# Patient Record
Sex: Female | Born: 2005 | Race: White | Hispanic: No | Marital: Single | Smoking: Never smoker
Health system: Southern US, Community
[De-identification: ages and names within clinical notes are randomized; demographics above are authoritative.]

## PROBLEM LIST (undated history)

## (undated) DIAGNOSIS — J302 Other seasonal allergic rhinitis: Secondary | ICD-10-CM

## (undated) DIAGNOSIS — F419 Anxiety disorder, unspecified: Secondary | ICD-10-CM

## (undated) DIAGNOSIS — N390 Urinary tract infection, site not specified: Secondary | ICD-10-CM

---
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
## 2024-08-25 ENCOUNTER — Other Ambulatory Visit: Payer: Self-pay

## 2024-08-25 ENCOUNTER — Emergency Department
Admission: EM | Admit: 2024-08-25 | Discharge: 2024-08-25 | Disposition: A | Discharge status: Home / Self Care | Attending: Emergency Medicine | Admitting: Emergency Medicine

## 2024-08-25 ENCOUNTER — Encounter: Payer: Self-pay | Admitting: Emergency Medicine

## 2024-08-25 DIAGNOSIS — J029 Acute pharyngitis, unspecified: Secondary | ICD-10-CM | POA: Diagnosis present

## 2024-08-25 DIAGNOSIS — R7989 Other specified abnormal findings of blood chemistry: Secondary | ICD-10-CM | POA: Insufficient documentation

## 2024-08-25 DIAGNOSIS — R6889 Other general symptoms and signs: Secondary | ICD-10-CM

## 2024-08-25 DIAGNOSIS — R059 Cough, unspecified: Secondary | ICD-10-CM | POA: Insufficient documentation

## 2024-08-25 DIAGNOSIS — D72829 Elevated white blood cell count, unspecified: Secondary | ICD-10-CM | POA: Insufficient documentation

## 2024-08-25 LAB — CBC
HCT: 37.9 % (ref 36.0–46.0)
Hemoglobin: 12.7 g/dL (ref 12.0–15.0)
MCH: 29.8 pg (ref 26.0–34.0)
MCHC: 33.5 g/dL (ref 30.0–36.0)
MCV: 89 fL (ref 80.0–100.0)
Platelets: 326 K/uL (ref 150–400)
RBC: 4.26 MIL/uL (ref 3.87–5.11)
RDW: 12.6 % (ref 11.5–15.5)
WBC: 15.6 K/uL — ABNORMAL HIGH (ref 4.0–10.5)
nRBC: 0 % (ref 0.0–0.2)

## 2024-08-25 LAB — LIPASE, BLOOD: Lipase: 19 U/L (ref 11–51)

## 2024-08-25 LAB — COMPREHENSIVE METABOLIC PANEL WITH GFR
ALT: 98 U/L — ABNORMAL HIGH (ref 0–44)
AST: 46 U/L — ABNORMAL HIGH (ref 15–41)
Albumin: 4.7 g/dL (ref 3.5–5.0)
Alkaline Phosphatase: 131 U/L — ABNORMAL HIGH (ref 38–126)
Anion gap: 10 (ref 5–15)
BUN: 14 mg/dL (ref 6–20)
CO2: 24 mmol/L (ref 22–32)
Calcium: 9.5 mg/dL (ref 8.9–10.3)
Chloride: 103 mmol/L (ref 98–111)
Creatinine, Ser: 0.83 mg/dL (ref 0.44–1.00)
GFR, Estimated: >60 mL/min
Glucose, Bld: 109 mg/dL — ABNORMAL HIGH (ref 70–99)
Potassium: 3.8 mmol/L (ref 3.5–5.1)
Sodium: 137 mmol/L (ref 135–145)
Total Bilirubin: 0.7 mg/dL (ref 0.0–1.2)
Total Protein: 7.8 g/dL (ref 6.5–8.1)

## 2024-08-25 LAB — URINALYSIS, ROUTINE W REFLEX MICROSCOPIC
Bilirubin Urine: NEGATIVE
Glucose, UA: NEGATIVE mg/dL
Ketones, ur: NEGATIVE mg/dL
Nitrite: POSITIVE — AB
Protein, ur: NEGATIVE mg/dL
Specific Gravity, Urine: 1.021 (ref 1.005–1.030)
Squamous Epithelial / HPF: >50 /HPF (ref 0–5)
WBC, UA: >50 WBC/hpf (ref 0–5)
pH: 6 (ref 5.0–8.0)

## 2024-08-25 LAB — RESP PANEL BY RT-PCR (RSV, FLU A&B, COVID)  RVPGX2
Influenza A by PCR: NEGATIVE
Influenza B by PCR: NEGATIVE
Resp Syncytial Virus by PCR: NEGATIVE
SARS Coronavirus 2 by RT PCR: NEGATIVE

## 2024-08-25 LAB — POC URINE PREG, ED: Preg Test, Ur: NEGATIVE

## 2024-08-25 LAB — GROUP A STREP BY PCR: Group A Strep by PCR: NOT DETECTED

## 2024-08-25 LAB — MONONUCLEOSIS SCREEN: Mono Screen: NEGATIVE

## 2024-08-25 MED ORDER — ONDANSETRON 4 MG PO TBDP
4.0000 mg | ORAL_TABLET | Freq: Once | ORAL | Status: AC
  Administered 2024-08-25: 4 mg via ORAL
  Filled 2024-08-25: qty 1

## 2024-08-25 MED ORDER — LIDOCAINE VISCOUS HCL 2 % MT SOLN
15.0000 mL | Freq: Once | OROMUCOSAL | Status: AC
  Administered 2024-08-25: 15 mL via OROMUCOSAL
  Filled 2024-08-25: qty 15

## 2024-08-25 MED ORDER — IBUPROFEN 800 MG PO TABS
800.0000 mg | ORAL_TABLET | Freq: Once | ORAL | Status: AC
  Administered 2024-08-25: 800 mg via ORAL
  Filled 2024-08-25: qty 1

## 2024-08-25 NOTE — ED Triage Notes (Signed)
 Pt reports waking up yesterday morning (12/23) w/ sore throat, generalized body aches, and chills and generalized abd pain w/ nausea that started around the same time.

## 2024-08-25 NOTE — ED Notes (Signed)
 Respiratory and strep swab; (2)greens and lav top tubes collected, labeled, and sent to lab.

## 2024-08-25 NOTE — Discharge Instructions (Addendum)
 Your COVID, flu, RSV and strep test today were negative.  Your Mono test is negative as well.  Your chest x-ray showed no pneumonia.  Your lab work showed elevated white blood cell count which is something that we see normally with infection.  Your liver function test were very minimally elevated.  I recommend avoiding Tylenol, alcohol, eating a low-fat diet and following up with your primary care doctor in 1 to 2 weeks to follow this.  I do not think that this is related to your illness today but could be secondary to fatty liver disease.   You may take ibuprofen  800 mg every 6 hours as needed for fever and pain.  Please take ibuprofen  with food.Please rest and drink plenty of fluids.   This is a viral illness causing your symptoms. You do not need antibiotics for a virus. Symptoms from a virus may take 7-14 days to run its course.  You may use over-the-counter nasal saline spray and Afrin nasal saline spray as needed for nasal congestion. Please do not use Afrin for more than 3 days in a row as this can cause worsening congestion. You may use guaifenesin and dextromethorphan as needed for cough.  You may use lozenges and Chloraseptic spray to help with sore throat.  Warm salt water gargles can also help with sore throat.  Honey has also been used successfully to help with cough, congestion.  You may use over-the-counter Unisom (doxyalamine) or Benadryl (diphenhydramine) to help with sleep.  If you have diarrhea, you may use over-the-counter Imodium.  Medication such as Pepcid , Mylanta or Maalox can help with symptoms of acid reflux and upper abdominal pain from upset stomach and recurrent vomiting.  Please note that some combination medicines such as DayQuil and NyQuil have multiple medications in them.  Please make sure you look at all labels to ensure that you are not taking too much of any one particular medication.

## 2024-08-25 NOTE — ED Provider Notes (Signed)
 "  [facility] Provider Note    Event Date/Time   First MD Initiated Contact with Patient 08/25/24 0559     (approximate)   History   Abdominal Pain and Sore Throat   HPI  Kayla Higgins is a 18 y.o. female with history of anxiety, seasonal allergies, recurrent UTIs who presents to the emergency department with complaints of headache, fevers, chills, body aches, cough, congestion, sore throat that started yesterday.  Has had nausea but no vomiting or diarrhea.  No dysuria, hematuria, urinary frequency or urgency, vaginal bleeding or discharge.  No known sick contacts.   History provided by patient and mother.    Past Medical History:  Diagnosis Date   Anxiety    Seasonal allergies    UTI (urinary tract infection)     History reviewed. No pertinent surgical history.  MEDICATIONS:  Prior to Admission medications  Medication Sig Start Date End Date Taking? Authorizing Provider  cephALEXin  (KEFLEX ) 500 MG capsule Take 1 capsule (500 mg total) by mouth 2 (two) times daily. 04/08/21   Gwenn Kent, MD  cyclobenzaprine  (FLEXERIL ) 5 MG tablet 1 tablet every 8 hours as needed for muscle spasms 05/11/22   Sung, Jade J, MD  naproxen  (NAPROSYN ) 500 MG tablet Take 1 tablet (500 mg total) by mouth 2 (two) times daily with a meal. 05/11/22   Robinette Vermell PARAS, MD  phenazopyridine  (PYRIDIUM ) 200 MG tablet Take 1 tablet (200 mg total) by mouth 3 (three) times daily. 04/08/21   Gwenn Kent, MD    Physical Exam   Triage Vital Signs: ED Triage Vitals [08/25/24 0331]  Encounter Vitals Group     BP (!) 141/75     Girls Systolic BP Percentile      Girls Diastolic BP Percentile      Boys Systolic BP Percentile      Boys Diastolic BP Percentile      Pulse Rate (!) 122     Resp 18     Temp 100.1 F (37.8 C)     Temp Source Oral     SpO2 97 %     Weight      Height      Head Circumference      Peak Flow      Pain Score 7     Pain Loc      Pain Education       Exclude from Growth Chart     Most recent vital signs: Vitals:   08/25/24 0730 08/25/24 0830  BP:  97/68  Pulse: 99 100  Resp:  20  Temp:  99.1 F (37.3 C)  SpO2: 96% 100%    CONSTITUTIONAL: Alert, responds appropriately to questions. Well-appearing; well-nourished HEAD: Normocephalic, atraumatic EYES: Conjunctivae clear, pupils appear equal, sclera nonicteric ENT: normal nose; moist mucous membranes, mild posterior pharyngeal erythema and tonsillar hypertrophy without exudate, no uvular deviation, no trismus or drooling, normal phonation NECK: Supple, normal ROM, no meningismus CARD: Regular and tachycardic; S1 and S2 appreciated RESP: Normal chest excursion without splinting or tachypnea; breath sounds clear and equal bilaterally; no wheezes, no rhonchi, no rales, no hypoxia or respiratory distress, speaking full sentences ABD/GI: Non-distended; soft, non-tender, no rebound, no guarding, no peritoneal signs, no tenderness in the right upper quadrant or McBurney's point BACK: The back appears normal EXT: Normal ROM in all joints; no deformity noted, no edema SKIN: Normal color for age and race; warm; no rash on exposed skin NEURO: Moves all extremities equally, normal  speech PSYCH: The patient's mood and manner are appropriate.   ED Results / Procedures / Treatments   LABS: (all labs ordered are listed, but only abnormal results are displayed) Labs Reviewed  COMPREHENSIVE METABOLIC PANEL WITH GFR - Abnormal; Notable for the following components:      Result Value   Glucose, Bld 109 (*)    AST 46 (*)    ALT 98 (*)    Alkaline Phosphatase 131 (*)    All other components within normal limits  CBC - Abnormal; Notable for the following components:   WBC 15.6 (*)    All other components within normal limits  URINALYSIS, ROUTINE W REFLEX MICROSCOPIC - Abnormal; Notable for the following components:   Color, Urine YELLOW (*)    APPearance CLOUDY (*)    Hgb urine dipstick  MODERATE (*)    Nitrite POSITIVE (*)    Leukocytes,Ua MODERATE (*)    Bacteria, UA MANY (*)    Non Squamous Epithelial PRESENT (*)    All other components within normal limits  GROUP A STREP BY PCR  RESP PANEL BY RT-PCR (RSV, FLU A&B, COVID)  [id number]  URINE CULTURE  LIPASE, BLOOD  MONONUCLEOSIS SCREEN  POC URINE PREG, ED     EKG:  RADIOLOGY: My personal review and interpretation of imaging: Chest x-ray clear.  I have personally reviewed all radiology reports.   No results found.   PROCEDURES:  Critical Care performed: No     Procedures    IMPRESSION / MDM / ASSESSMENT AND PLAN / ED COURSE  I reviewed the triage vital signs and the nursing notes.    Patient here with complaints of fevers, cough, congestion, sore throat.   DIFFERENTIAL DIAGNOSIS (includes but not limited to):   Influenza, other viral URI, viral pharyngitis, strep pharyngitis, doubt deep space neck infection, PTA.  Doubt meningitis, pneumonia.   Patient's presentation is most consistent with acute complicated illness / injury requiring diagnostic workup.   PLAN: Workup initiated from triage.  Patient does have a leukocytosis of 15,000 and is tachycardic but is nontoxic and does not appear septic today.  Strep, COVID, flu and RSV are negative today.  Lungs are clear to auscultation with no increased work of breathing or respiratory distress.  Low suspicion for pneumonia no indication for chest x-ray.  She does have nitrites, many white blood cells and bacteria in her urine but also many squamous cells.  Could be contaminated sample.  Patient states she has had many UTIs before and states this does not feel like that at all.  Will add on urine culture but she would like to hold antibiotics at this time as she does not feel this is a UTI which I feel is reasonable.  Pregnancy test is negative.  Her liver function test today are minimally elevated with no old for comparison.  This could be related to  fatty liver disease as patient is overweight.  Recommended avoiding Tylenol, alcohol and eating a low-fat diet and having her primary care doctor follow this in 1 to 2 weeks.  She has no right upper quadrant abdominal tenderness on exam.  Lipase is normal.  Low suspicion for cholelithiasis, cholecystitis, pancreatitis, hepatitis.  Given elevated liver function test with sore throat, will obtain Monospot for further evaluation.  Discussed with patient and mother that this would not change her management as this would also be viral but could help with expectations.  Will give medications for symptomatic relief here.   MEDICATIONS GIVEN  IN ED: Medications  ibuprofen  (ADVIL ) tablet 800 mg (800 mg Oral Given 08/25/24 0627)  ondansetron  (ZOFRAN -ODT) disintegrating tablet 4 mg (4 mg Oral Given 08/25/24 0624)  lidocaine  (XYLOCAINE ) 2 % viscous mouth solution 15 mL (15 mLs Mouth/Throat Given 08/25/24 9374)     ED COURSE: Monospot negative.  Will discharge home with supportive care instructions.  At this time, I do not feel there is any life-threatening condition present. I reviewed all nursing notes, vitals, pertinent previous records.  All lab and urine results, EKGs, imaging ordered have been independently reviewed and interpreted by myself.  I reviewed all available radiology reports from any imaging ordered this visit.  Based on my assessment, I feel the patient is safe to be discharged home without further emergent workup and can continue workup as an outpatient as needed. Discussed all findings, treatment plan as well as usual and customary return precautions.  They verbalize understanding and are comfortable with this plan.  Outpatient follow-up has been provided as needed.  All questions have been answered.    CONSULTS:  none   OUTSIDE RECORDS REVIEWED: Reviewed recent ENT notes.       FINAL CLINICAL IMPRESSION(S) / ED DIAGNOSES   Final diagnoses:  Flu-like symptoms  Elevated liver  function tests     Rx / DC Orders   ED Discharge Orders     None        Note:  This document was prepared using Dragon voice recognition software and may include unintentional dictation errors.   Harjit Leider, Josette SAILOR, DO 08/25/24 2305  "

## 2024-08-27 LAB — URINE CULTURE: Culture: >=100000 — AB
